# Patient Record
Sex: Female | Born: 1960 | Race: Black or African American | Hispanic: No | Marital: Single | Smoking: Never smoker
Health system: Southern US, Community
[De-identification: ages and names within clinical notes are randomized; demographics above are authoritative.]

## PROBLEM LIST (undated history)

## (undated) DIAGNOSIS — E041 Nontoxic single thyroid nodule: Secondary | ICD-10-CM

## (undated) HISTORY — PX: TUBAL LIGATION: SHX77

---

## 1997-07-03 ENCOUNTER — Other Ambulatory Visit: Admission: RE | Admit: 1997-07-03 | Discharge: 1997-07-03 | Payer: Self-pay | Admitting: Orthopedic Surgery

## 1997-07-22 ENCOUNTER — Other Ambulatory Visit: Admission: RE | Admit: 1997-07-22 | Discharge: 1997-07-22 | Payer: Self-pay | Admitting: Sports Medicine

## 1998-03-24 ENCOUNTER — Encounter: Payer: Self-pay | Admitting: Emergency Medicine

## 1998-03-24 ENCOUNTER — Emergency Department (HOSPITAL_COMMUNITY): Admission: EM | Admit: 1998-03-24 | Discharge: 1998-03-24 | Payer: Self-pay | Admitting: Internal Medicine

## 1998-04-04 ENCOUNTER — Ambulatory Visit (HOSPITAL_COMMUNITY): Admission: RE | Admit: 1998-04-04 | Discharge: 1998-04-04 | Payer: Self-pay | Admitting: Obstetrics

## 1998-04-28 ENCOUNTER — Inpatient Hospital Stay (HOSPITAL_COMMUNITY): Admission: AD | Admit: 1998-04-28 | Discharge: 1998-04-28 | Payer: Self-pay | Admitting: Obstetrics

## 1998-06-28 ENCOUNTER — Inpatient Hospital Stay (HOSPITAL_COMMUNITY): Admission: AD | Admit: 1998-06-28 | Discharge: 1998-06-28 | Payer: Self-pay | Admitting: Obstetrics

## 1998-09-30 ENCOUNTER — Inpatient Hospital Stay (HOSPITAL_COMMUNITY): Admission: AD | Admit: 1998-09-30 | Discharge: 1998-09-30 | Payer: Self-pay | Admitting: Obstetrics

## 1998-10-07 ENCOUNTER — Inpatient Hospital Stay (HOSPITAL_COMMUNITY): Admission: AD | Admit: 1998-10-07 | Discharge: 1998-10-07 | Payer: Self-pay | Admitting: Obstetrics

## 1998-11-08 ENCOUNTER — Encounter (INDEPENDENT_AMBULATORY_CARE_PROVIDER_SITE_OTHER): Payer: Self-pay

## 1998-11-08 ENCOUNTER — Inpatient Hospital Stay (HOSPITAL_COMMUNITY): Admission: AD | Admit: 1998-11-08 | Discharge: 1998-11-11 | Payer: Self-pay | Admitting: Obstetrics

## 1999-09-19 ENCOUNTER — Emergency Department (HOSPITAL_COMMUNITY): Admission: EM | Admit: 1999-09-19 | Discharge: 1999-09-19 | Payer: Self-pay | Admitting: Emergency Medicine

## 2000-08-31 ENCOUNTER — Inpatient Hospital Stay (HOSPITAL_COMMUNITY): Admission: AD | Admit: 2000-08-31 | Discharge: 2000-08-31 | Payer: Self-pay | Admitting: Obstetrics

## 2001-01-02 ENCOUNTER — Emergency Department (HOSPITAL_COMMUNITY): Admission: EM | Admit: 2001-01-02 | Discharge: 2001-01-02 | Payer: Self-pay | Admitting: Emergency Medicine

## 2003-11-26 ENCOUNTER — Emergency Department (HOSPITAL_COMMUNITY): Admission: EM | Admit: 2003-11-26 | Discharge: 2003-11-27 | Payer: Self-pay | Admitting: Emergency Medicine

## 2006-05-03 ENCOUNTER — Emergency Department (HOSPITAL_COMMUNITY): Admission: EM | Admit: 2006-05-03 | Discharge: 2006-05-03 | Payer: Self-pay | Admitting: Family Medicine

## 2006-05-07 ENCOUNTER — Emergency Department (HOSPITAL_COMMUNITY): Admission: EM | Admit: 2006-05-07 | Discharge: 2006-05-08 | Payer: Self-pay | Admitting: Emergency Medicine

## 2009-11-20 ENCOUNTER — Emergency Department (HOSPITAL_COMMUNITY): Admission: EM | Admit: 2009-11-20 | Discharge: 2009-11-20 | Payer: Self-pay | Admitting: Emergency Medicine

## 2010-06-05 ENCOUNTER — Other Ambulatory Visit (HOSPITAL_COMMUNITY)
Admission: RE | Admit: 2010-06-05 | Discharge: 2010-06-05 | Disposition: A | Discharge status: Home / Self Care | Payer: BC Managed Care – PPO | Source: Ambulatory Visit | Attending: Family Medicine | Admitting: Family Medicine

## 2010-06-05 ENCOUNTER — Other Ambulatory Visit: Payer: Self-pay | Admitting: Physician Assistant

## 2010-06-05 DIAGNOSIS — Z0000 Encounter for general adult medical examination without abnormal findings: Secondary | ICD-10-CM | POA: Insufficient documentation

## 2010-06-05 DIAGNOSIS — Z113 Encounter for screening for infections with a predominantly sexual mode of transmission: Secondary | ICD-10-CM | POA: Insufficient documentation

## 2010-06-11 ENCOUNTER — Other Ambulatory Visit: Payer: Self-pay | Admitting: Family Medicine

## 2010-06-11 DIAGNOSIS — E049 Nontoxic goiter, unspecified: Secondary | ICD-10-CM

## 2010-06-12 ENCOUNTER — Ambulatory Visit
Admission: RE | Admit: 2010-06-12 | Discharge: 2010-06-12 | Disposition: A | Discharge status: Home / Self Care | Payer: BC Managed Care – PPO | Source: Ambulatory Visit | Attending: Family Medicine | Admitting: Family Medicine

## 2010-06-12 DIAGNOSIS — E049 Nontoxic goiter, unspecified: Secondary | ICD-10-CM

## 2010-06-15 ENCOUNTER — Other Ambulatory Visit: Payer: Self-pay | Admitting: Family Medicine

## 2010-06-15 DIAGNOSIS — E042 Nontoxic multinodular goiter: Secondary | ICD-10-CM

## 2010-06-16 ENCOUNTER — Ambulatory Visit
Admission: RE | Admit: 2010-06-16 | Discharge: 2010-06-16 | Disposition: A | Discharge status: Home / Self Care | Payer: BC Managed Care – PPO | Source: Ambulatory Visit | Attending: Family Medicine | Admitting: Family Medicine

## 2010-06-16 ENCOUNTER — Other Ambulatory Visit: Payer: Self-pay | Admitting: Interventional Radiology

## 2010-06-16 ENCOUNTER — Other Ambulatory Visit (HOSPITAL_COMMUNITY)
Admission: RE | Admit: 2010-06-16 | Discharge: 2010-06-16 | Disposition: A | Discharge status: Home / Self Care | Payer: BC Managed Care – PPO | Source: Ambulatory Visit | Attending: Interventional Radiology | Admitting: Interventional Radiology

## 2010-06-16 DIAGNOSIS — E042 Nontoxic multinodular goiter: Secondary | ICD-10-CM

## 2010-06-16 DIAGNOSIS — E049 Nontoxic goiter, unspecified: Secondary | ICD-10-CM | POA: Insufficient documentation

## 2010-08-21 ENCOUNTER — Inpatient Hospital Stay (INDEPENDENT_AMBULATORY_CARE_PROVIDER_SITE_OTHER)
Admission: RE | Admit: 2010-08-21 | Discharge: 2010-08-21 | Disposition: A | Discharge status: Home / Self Care | Payer: BC Managed Care – PPO | Source: Ambulatory Visit | Attending: Family Medicine | Admitting: Family Medicine

## 2010-08-21 DIAGNOSIS — L0291 Cutaneous abscess, unspecified: Secondary | ICD-10-CM

## 2010-08-21 DIAGNOSIS — L0390 Cellulitis, unspecified: Secondary | ICD-10-CM

## 2010-11-23 ENCOUNTER — Inpatient Hospital Stay (INDEPENDENT_AMBULATORY_CARE_PROVIDER_SITE_OTHER)
Admission: RE | Admit: 2010-11-23 | Discharge: 2010-11-23 | Disposition: A | Discharge status: Home / Self Care | Payer: BC Managed Care – PPO | Source: Ambulatory Visit | Attending: Family Medicine | Admitting: Family Medicine

## 2010-11-23 DIAGNOSIS — B350 Tinea barbae and tinea capitis: Secondary | ICD-10-CM

## 2010-11-23 DIAGNOSIS — H6980 Other specified disorders of Eustachian tube, unspecified ear: Secondary | ICD-10-CM

## 2011-02-01 ENCOUNTER — Other Ambulatory Visit: Payer: Self-pay | Admitting: Internal Medicine

## 2011-02-01 DIAGNOSIS — E042 Nontoxic multinodular goiter: Secondary | ICD-10-CM

## 2011-02-23 ENCOUNTER — Other Ambulatory Visit: Payer: BC Managed Care – PPO

## 2011-03-10 ENCOUNTER — Other Ambulatory Visit: Payer: BC Managed Care – PPO

## 2011-03-16 ENCOUNTER — Ambulatory Visit
Admission: RE | Admit: 2011-03-16 | Discharge: 2011-03-16 | Disposition: A | Discharge status: Home / Self Care | Payer: BC Managed Care – PPO | Source: Ambulatory Visit | Attending: Internal Medicine | Admitting: Internal Medicine

## 2011-03-16 DIAGNOSIS — E042 Nontoxic multinodular goiter: Secondary | ICD-10-CM

## 2011-06-11 ENCOUNTER — Emergency Department (INDEPENDENT_AMBULATORY_CARE_PROVIDER_SITE_OTHER)
Admission: EM | Admit: 2011-06-11 | Discharge: 2011-06-11 | Disposition: A | Discharge status: Home / Self Care | Payer: BC Managed Care – PPO | Source: Home / Self Care | Attending: Emergency Medicine | Admitting: Emergency Medicine

## 2011-06-11 ENCOUNTER — Encounter (HOSPITAL_COMMUNITY): Payer: Self-pay

## 2011-06-11 ENCOUNTER — Emergency Department (INDEPENDENT_AMBULATORY_CARE_PROVIDER_SITE_OTHER): Payer: BC Managed Care – PPO

## 2011-06-11 DIAGNOSIS — S82409A Unspecified fracture of shaft of unspecified fibula, initial encounter for closed fracture: Secondary | ICD-10-CM

## 2011-06-11 MED ORDER — TRAMADOL HCL 50 MG PO TABS: 100.0000 mg | ORAL_TABLET | Freq: Three times a day (TID) | ORAL | Status: AC | PRN
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
## 2011-06-11 NOTE — Discharge Instructions (Signed)
Cast or Splint Care Casts and splints support injured limbs and keep bones from moving while they heal.  HOME CARE  Keep the cast or splint uncovered during the drying period.   A plaster cast can take 24 to 48 hours to dry.   A fiberglass cast will dry in less than 1 hour.   Do not rest the cast on anything harder than a pillow for 24 hours.   Do not put weight on your injured limb. Do not put pressure on the cast. Wait for your doctor's approval.   Keep the cast or splint dry.   Cover the cast or splint with a plastic bag during baths or wet weather.   If you have a cast over your chest and belly (trunk), take sponge baths until the cast is taken off.   Keep your cast or splint clean. Wash a dirty cast with a damp cloth.   Do not put any objects under your cast or splint. Do not scratch the skin under the cast with an object.   Do not take out the padding from inside your cast.   Exercise your joints near the cast as told by your doctor.   Raise (elevate) your injured limb on 1 or 2 pillows for the first 1 to 3 days.  GET HELP RIGHT AWAY IF:  Your cast or splint cracks.   Your cast or splint is too tight or too loose.   You itch badly under the cast.   Your cast gets wet or has a soft spot.   You have a bad smell coming from the cast.   You get an object stuck under the cast.   Your skin around the cast becomes red or raw.   You have new or more pain after the cast is put on.   You have fluid leaking through the cast.   You cannot move your fingers or toes.   Your fingers or toes turn colors or are cool, painful, or puffy (swollen).   You have tingling or lose feeling (numbness) around the injured area.   You have pain or pressure under the cast.   You have trouble breathing or have shortness of breath.   You have chest pain.  MAKE SURE YOU:  Understand these instructions.   Will watch your condition.   Will get help right away if you are not doing  well or get worse.  Document Released: 06/03/2010 Document Revised: 01/21/2011 Document Reviewed: 06/03/2010 [facility] Patient Information 2012 [place], [place].Fibular Fracture, Ankle, Adult, Undisplaced, Treated with Immobilization You have a break (fracture) of your fibula at the end of this bone which makes up part of your ankle. This is the bone in your lower leg located on the outside of the leg and it makes up the bump you feel on the outside of your ankle. These fractures are easily diagnosed with x-rays. TREATMENT  You have a simple fracture (this means it is in good position and the bones are not displaced) of the part of the fibula that is located at the ankle. This usually will heal without disability and can often be treated with only casting or splinting depending on the nature of the break.  HOME CARE INSTRUCTIONS   Apply ice to the injury for 15 to 20 minutes, 3 to 4 times per day while awake, for 2 days. Put the ice in a plastic bag and place a thin towel between the bag of ice and your leg. This  helps keep swelling down.   Use crutches as directed. Resume walking without crutches as directed by your caregiver or when comfortable doing so.   Only take over-the-counter or prescription medicines for pain, discomfort, or fever as directed by your caregiver.   Keep appointments for follow up X-rays if these are required.   If you have a removable splint or boot, do not remove the boot unless directed by your caregiver.   Warning: Do not drive a car or operate a motor vehicle until your caregiver specifically tells you it is safe to do so.  SEEK IMMEDIATE MEDICAL CARE IF:   Your cast gets damaged or breaks.   You have continued severe pain or more swelling than you did before the cast was put on, or the pain is not controlled with medications.   Your skin or nails below the injury turn blue or grey, or feel cold or numb.   There is a bad smell, or new stains and/or pus like  (purulent ) drainage coming from under the cast.   You develop severe pain in ankle or foot.  MAKE SURE YOU:   Understand these instructions.   Will watch your condition.   Will get help right away if you are not doing well or get worse.  Document Released: 10/24/2001 Document Revised: 01/21/2011 Document Reviewed: 09/08/2007 [facility] Patient Information 2012 [place], [place].

## 2011-06-11 NOTE — Progress Notes (Signed)
Orthopedic Tech Progress Note Patient Details:  Angelica Ponce [facility] 1960/12/01 [id number]  Type of Splint: Short Leg Splint Location: (L) LE Splint Interventions: Application    Jennye Moccasin 06/11/2011, 3:13 PM

## 2011-08-26 ENCOUNTER — Ambulatory Visit: Payer: BC Managed Care – PPO | Attending: Orthopedic Surgery

## 2011-08-26 ENCOUNTER — Ambulatory Visit: Payer: BC Managed Care – PPO | Admitting: Rehabilitation

## 2011-08-26 DIAGNOSIS — IMO0001 Reserved for inherently not codable concepts without codable children: Secondary | ICD-10-CM | POA: Insufficient documentation

## 2011-08-26 DIAGNOSIS — M25579 Pain in unspecified ankle and joints of unspecified foot: Secondary | ICD-10-CM | POA: Insufficient documentation

## 2011-09-21 ENCOUNTER — Ambulatory Visit: Payer: BC Managed Care – PPO | Attending: Orthopedic Surgery

## 2011-09-21 DIAGNOSIS — IMO0001 Reserved for inherently not codable concepts without codable children: Secondary | ICD-10-CM | POA: Insufficient documentation

## 2011-09-21 DIAGNOSIS — M25579 Pain in unspecified ankle and joints of unspecified foot: Secondary | ICD-10-CM | POA: Insufficient documentation

## 2012-05-12 ENCOUNTER — Other Ambulatory Visit: Payer: Self-pay | Admitting: Internal Medicine

## 2012-05-12 DIAGNOSIS — E049 Nontoxic goiter, unspecified: Secondary | ICD-10-CM

## 2012-05-23 ENCOUNTER — Ambulatory Visit
Admission: RE | Admit: 2012-05-23 | Discharge: 2012-05-23 | Disposition: A | Discharge status: Home / Self Care | Payer: BC Managed Care – PPO | Source: Ambulatory Visit | Attending: Internal Medicine | Admitting: Internal Medicine

## 2012-05-23 DIAGNOSIS — E049 Nontoxic goiter, unspecified: Secondary | ICD-10-CM

## 2012-08-07 ENCOUNTER — Other Ambulatory Visit (HOSPITAL_COMMUNITY)
Admission: RE | Admit: 2012-08-07 | Discharge: 2012-08-07 | Disposition: A | Discharge status: Home / Self Care | Payer: BC Managed Care – PPO | Source: Ambulatory Visit | Attending: Physician Assistant | Admitting: Physician Assistant

## 2012-08-07 ENCOUNTER — Other Ambulatory Visit: Payer: Self-pay | Admitting: Physician Assistant

## 2012-08-07 ENCOUNTER — Other Ambulatory Visit: Payer: Self-pay | Admitting: Family Medicine

## 2012-08-07 DIAGNOSIS — Z1151 Encounter for screening for human papillomavirus (HPV): Secondary | ICD-10-CM | POA: Insufficient documentation

## 2012-08-07 DIAGNOSIS — Z0000 Encounter for general adult medical examination without abnormal findings: Secondary | ICD-10-CM | POA: Insufficient documentation

## 2012-08-11 ENCOUNTER — Ambulatory Visit
Admission: RE | Admit: 2012-08-11 | Discharge: 2012-08-11 | Disposition: A | Discharge status: Home / Self Care | Payer: BC Managed Care – PPO | Source: Ambulatory Visit | Attending: Family Medicine | Admitting: Family Medicine

## 2012-11-17 ENCOUNTER — Other Ambulatory Visit: Payer: Self-pay | Admitting: Internal Medicine

## 2012-11-17 DIAGNOSIS — E042 Nontoxic multinodular goiter: Secondary | ICD-10-CM

## 2012-11-22 ENCOUNTER — Ambulatory Visit
Admission: RE | Admit: 2012-11-22 | Discharge: 2012-11-22 | Disposition: A | Discharge status: Home / Self Care | Payer: BC Managed Care – PPO | Source: Ambulatory Visit | Attending: Internal Medicine | Admitting: Internal Medicine

## 2012-11-22 DIAGNOSIS — E042 Nontoxic multinodular goiter: Secondary | ICD-10-CM

## 2013-06-28 ENCOUNTER — Other Ambulatory Visit: Payer: Self-pay | Admitting: Obstetrics & Gynecology

## 2013-06-28 ENCOUNTER — Other Ambulatory Visit (HOSPITAL_COMMUNITY)
Admission: RE | Admit: 2013-06-28 | Discharge: 2013-06-28 | Disposition: A | Discharge status: Home / Self Care | Payer: BC Managed Care – PPO | Source: Ambulatory Visit | Attending: Obstetrics & Gynecology | Admitting: Obstetrics & Gynecology

## 2013-06-28 DIAGNOSIS — Z113 Encounter for screening for infections with a predominantly sexual mode of transmission: Secondary | ICD-10-CM | POA: Insufficient documentation

## 2013-06-28 DIAGNOSIS — Z124 Encounter for screening for malignant neoplasm of cervix: Secondary | ICD-10-CM | POA: Insufficient documentation

## 2013-06-28 DIAGNOSIS — Z1151 Encounter for screening for human papillomavirus (HPV): Secondary | ICD-10-CM | POA: Insufficient documentation

## 2013-11-01 ENCOUNTER — Other Ambulatory Visit: Payer: Self-pay | Admitting: Obstetrics & Gynecology

## 2014-04-17 ENCOUNTER — Emergency Department (HOSPITAL_COMMUNITY)
Admission: EM | Admit: 2014-04-17 | Discharge: 2014-04-17 | Disposition: A | Discharge status: Home / Self Care | Payer: BLUE CROSS/BLUE SHIELD | Source: Home / Self Care | Attending: Family Medicine | Admitting: Family Medicine

## 2014-04-17 ENCOUNTER — Encounter (HOSPITAL_COMMUNITY): Payer: Self-pay | Admitting: Family Medicine

## 2014-04-17 DIAGNOSIS — R6889 Other general symptoms and signs: Secondary | ICD-10-CM

## 2014-04-17 HISTORY — DX: Nontoxic single thyroid nodule: E04.1

## 2014-04-17 LAB — POCT RAPID STREP A: STREPTOCOCCUS, GROUP A SCREEN (DIRECT): NEGATIVE

## 2014-04-17 NOTE — ED Provider Notes (Signed)
CSN: [id number]     Arrival date & time 04/17/14  1213 History   First MD Initiated Contact with Patient 04/17/14 1323     Chief Complaint  Patient presents with  . URI   (Consider location/radiation/quality/duration/timing/severity/associated sxs/prior Treatment) HPI  Cold symptoms started 4 days ago. Ache joints, subjective fevers, chills, runny nose and cough. Denies sore throat. Taking ibupfrofen and BC and robitussin w/ some improvement. Constant. No chagne in overall condition. Denies nausea, vomiting, dysuria, frequency, ABD pain, CP, SOB, palpitations.    Past Medical History  Diagnosis Date  . Thyroid nodule    Past Surgical History  Procedure Laterality Date  . Tubal ligation     Family History  Problem Relation Age of Onset  . Cancer Mother     breast cancer  . Migraines Mother   . Heart attack Maternal Grandmother   . Migraines Maternal Grandmother   . Heart attack Paternal Grandmother   . Migraines Paternal Grandmother    History  Substance Use Topics  . Smoking status: Never Smoker   . Smokeless tobacco: Not on file  . Alcohol Use: Yes   OB History    No data available     Review of Systems Per HPI with all other pertinent systems negative.   Allergies  Review of patient's allergies indicates no known allergies.  Home Medications   Prior to Admission medications   Not on File   BP 113/75 mmHg  Pulse 88  Temp(Src) 99.2 F (37.3 C) (Oral)  Resp 16  SpO2 100%  LMP 03/24/2014 Physical Exam  Constitutional: She is oriented to person, place, and time. She appears well-developed and well-nourished.  HENT:  Head: Normocephalic and atraumatic.  Eyes: EOM are normal. Pupils are equal, round, and reactive to light.  Neck: Normal range of motion. Thyromegaly present.  Cardiovascular: Normal rate.   No murmur heard. Pulmonary/Chest: Effort normal. No respiratory distress. She has no wheezes. She has no rales. She exhibits no tenderness.  Abdominal:  Soft. Bowel sounds are normal.  Musculoskeletal: Normal range of motion.  Neurological: She is alert and oriented to person, place, and time.  Skin: Skin is warm.  Psychiatric: She has a normal mood and affect. Her behavior is normal. Judgment and thought content normal.    ED Course  Procedures (including critical care time) Labs Review Labs Reviewed  POCT RAPID STREP A ([facility] ONLY)    Imaging Review No results found.   MDM   1. Flu-like symptoms    Patient beyond window for treatment with Tamiflu. Conservative management including fluids, NSAIDs, rest.  No sign of pneumonia, or bacterial sinusitis. Work note provided . Precautions given and all questions answered   Shelly Flattenavid Merrell, MD Family Medicine 04/17/2014, 1:40 PM        Ozella Rocksavid J Merrell, MD 04/17/14 1340

## 2014-04-17 NOTE — ED Notes (Signed)
C/o cold sx onset Sunday Sx include ST, chills, HA, cough, chest d/c when coughing, bilateral leg pain Taking OTC meds w/no relief Alert, no signs of acute distress.

## 2014-04-17 NOTE — Discharge Instructions (Signed)
You likely have the flu. The flu illness typically lasts 3-7 days. You should start getting better soon. These stay well hydrated, get plenty of rest, use ibuprofen 600 mg every 6 hours for pain relief, please also try to stay active and remember to take lots of deep breaths to help prevent pneumonia. Please come back or go to the emergency room if he gets significantly worse.

## 2014-04-19 LAB — CULTURE, GROUP A STREP: Strep A Culture: NEGATIVE

## 2014-11-18 ENCOUNTER — Other Ambulatory Visit: Payer: Self-pay | Admitting: Internal Medicine

## 2014-11-18 DIAGNOSIS — R9389 Abnormal findings on diagnostic imaging of other specified body structures: Secondary | ICD-10-CM

## 2014-11-18 DIAGNOSIS — E049 Nontoxic goiter, unspecified: Secondary | ICD-10-CM

## 2014-11-21 ENCOUNTER — Ambulatory Visit
Admission: RE | Admit: 2014-11-21 | Discharge: 2014-11-21 | Disposition: A | Discharge status: Home / Self Care | Payer: Managed Care, Other (non HMO) | Source: Ambulatory Visit | Attending: Internal Medicine | Admitting: Internal Medicine

## 2014-11-21 DIAGNOSIS — R9389 Abnormal findings on diagnostic imaging of other specified body structures: Secondary | ICD-10-CM

## 2014-11-21 DIAGNOSIS — E049 Nontoxic goiter, unspecified: Secondary | ICD-10-CM

## 2014-12-10 ENCOUNTER — Other Ambulatory Visit (HOSPITAL_COMMUNITY)
Admission: RE | Admit: 2014-12-10 | Discharge: 2014-12-10 | Disposition: A | Discharge status: Home / Self Care | Payer: Managed Care, Other (non HMO) | Source: Ambulatory Visit | Attending: Obstetrics & Gynecology | Admitting: Obstetrics & Gynecology

## 2014-12-10 ENCOUNTER — Other Ambulatory Visit: Payer: Self-pay | Admitting: Obstetrics & Gynecology

## 2014-12-10 DIAGNOSIS — Z113 Encounter for screening for infections with a predominantly sexual mode of transmission: Secondary | ICD-10-CM | POA: Insufficient documentation

## 2014-12-10 DIAGNOSIS — Z01411 Encounter for gynecological examination (general) (routine) with abnormal findings: Secondary | ICD-10-CM | POA: Diagnosis not present

## 2014-12-11 LAB — CYTOLOGY - PAP

## 2017-12-14 ENCOUNTER — Other Ambulatory Visit: Payer: Self-pay | Admitting: Obstetrics & Gynecology

## 2017-12-14 DIAGNOSIS — N6320 Unspecified lump in the left breast, unspecified quadrant: Secondary | ICD-10-CM

## 2017-12-20 ENCOUNTER — Ambulatory Visit
Admission: RE | Admit: 2017-12-20 | Discharge: 2017-12-20 | Disposition: A | Discharge status: Home / Self Care | Payer: Managed Care, Other (non HMO) | Source: Ambulatory Visit | Attending: Obstetrics & Gynecology | Admitting: Obstetrics & Gynecology

## 2017-12-20 DIAGNOSIS — N6320 Unspecified lump in the left breast, unspecified quadrant: Secondary | ICD-10-CM

## 2019-08-13 ENCOUNTER — Other Ambulatory Visit: Payer: Self-pay | Admitting: Obstetrics & Gynecology

## 2019-08-13 DIAGNOSIS — Z1231 Encounter for screening mammogram for malignant neoplasm of breast: Secondary | ICD-10-CM

## 2019-09-14 ENCOUNTER — Ambulatory Visit: Payer: Managed Care, Other (non HMO)

## 2019-09-28 ENCOUNTER — Ambulatory Visit: Payer: Managed Care, Other (non HMO)

## 2020-07-24 ENCOUNTER — Other Ambulatory Visit: Payer: Self-pay

## 2020-07-24 ENCOUNTER — Ambulatory Visit (INDEPENDENT_AMBULATORY_CARE_PROVIDER_SITE_OTHER): Payer: Managed Care, Other (non HMO)

## 2020-07-24 ENCOUNTER — Ambulatory Visit (HOSPITAL_COMMUNITY)
Admission: EM | Admit: 2020-07-24 | Discharge: 2020-07-24 | Disposition: A | Discharge status: Home / Self Care | Payer: Managed Care, Other (non HMO) | Attending: Physician Assistant | Admitting: Physician Assistant

## 2020-07-24 ENCOUNTER — Encounter (HOSPITAL_COMMUNITY): Payer: Self-pay

## 2020-07-24 DIAGNOSIS — M545 Low back pain, unspecified: Secondary | ICD-10-CM

## 2020-07-24 DIAGNOSIS — M25551 Pain in right hip: Secondary | ICD-10-CM | POA: Diagnosis not present

## 2020-07-24 DIAGNOSIS — R52 Pain, unspecified: Secondary | ICD-10-CM

## 2020-07-24 MED ORDER — TIZANIDINE HCL 4 MG PO CAPS: 4.0000 mg | ORAL_CAPSULE | Freq: Three times a day (TID) | ORAL | 0 refills | Status: DC

## 2020-07-24 MED ORDER — NAPROXEN 375 MG PO TABS: 375.0000 mg | ORAL_TABLET | Freq: Two times a day (BID) | ORAL | 0 refills | Status: DC
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
## 2020-07-24 NOTE — Discharge Instructions (Signed)
Your x-ray showed no fractures or dislocations which is great news.  There was an incidental finding of a calcified area around your bladder/uterus.  If you develop any pain or blood in your urine please be reevaluated.  Follow-up with your primary care provider regarding this.  I have called in Naprosyn and you can take this twice a day as needed.  You should not take additional NSAIDs including aspirin, ibuprofen/Advil, naproxen/Aleve with this medication due to risk of GI bleeding.  I have also called in tizanidine which is a muscle relaxer.  You should not drive or drink alcohol with this medication as drowsiness is a common side effect.  Use heat and stretch for symptom relief.  Follow-up with your primary care provider and consider a referral to physical therapy.
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
## 2020-11-17 ENCOUNTER — Other Ambulatory Visit: Payer: Self-pay | Admitting: Obstetrics & Gynecology

## 2020-11-17 DIAGNOSIS — Z1231 Encounter for screening mammogram for malignant neoplasm of breast: Secondary | ICD-10-CM

## 2020-12-19 ENCOUNTER — Ambulatory Visit
Admission: RE | Admit: 2020-12-19 | Discharge: 2020-12-19 | Disposition: A | Discharge status: Home / Self Care | Payer: Managed Care, Other (non HMO) | Source: Ambulatory Visit | Attending: Obstetrics & Gynecology | Admitting: Obstetrics & Gynecology

## 2020-12-19 ENCOUNTER — Other Ambulatory Visit: Payer: Self-pay

## 2020-12-19 DIAGNOSIS — Z1231 Encounter for screening mammogram for malignant neoplasm of breast: Secondary | ICD-10-CM

## 2021-01-22 ENCOUNTER — Other Ambulatory Visit: Payer: Self-pay

## 2021-01-22 ENCOUNTER — Ambulatory Visit: Payer: Managed Care, Other (non HMO) | Admitting: Orthopaedic Surgery

## 2021-01-22 DIAGNOSIS — G8929 Other chronic pain: Secondary | ICD-10-CM | POA: Diagnosis not present

## 2021-01-22 DIAGNOSIS — M5441 Lumbago with sciatica, right side: Secondary | ICD-10-CM
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
## 2021-01-23 ENCOUNTER — Ambulatory Visit: Payer: Managed Care, Other (non HMO) | Admitting: Orthopaedic Surgery

## 2021-02-10 ENCOUNTER — Ambulatory Visit: Payer: Managed Care, Other (non HMO) | Admitting: Orthopaedic Surgery

## 2021-02-10 ENCOUNTER — Other Ambulatory Visit: Payer: Self-pay

## 2021-02-10 DIAGNOSIS — M79604 Pain in right leg: Secondary | ICD-10-CM

## 2021-02-10 MED ORDER — PREGABALIN 75 MG PO CAPS: 75.0000 mg | ORAL_CAPSULE | Freq: Two times a day (BID) | ORAL | 2 refills | Status: DC

## 2021-02-10 MED ORDER — DICLOFENAC SODIUM 75 MG PO TBEC
75.0000 mg | DELAYED_RELEASE_TABLET | Freq: Two times a day (BID) | ORAL | 2 refills | Status: DC
Start: 2021-02-10 — End: 2022-12-30
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
## 2021-07-27 ENCOUNTER — Other Ambulatory Visit: Payer: Self-pay | Admitting: Obstetrics & Gynecology

## 2021-07-27 ENCOUNTER — Other Ambulatory Visit: Payer: Self-pay | Admitting: Nurse Practitioner

## 2021-07-27 DIAGNOSIS — Z1231 Encounter for screening mammogram for malignant neoplasm of breast: Secondary | ICD-10-CM
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
## 2021-12-25 ENCOUNTER — Ambulatory Visit
Admission: RE | Admit: 2021-12-25 | Discharge: 2021-12-25 | Disposition: A | Discharge status: Home / Self Care | Payer: Managed Care, Other (non HMO) | Source: Ambulatory Visit | Attending: Nurse Practitioner | Admitting: Nurse Practitioner

## 2021-12-25 DIAGNOSIS — Z1231 Encounter for screening mammogram for malignant neoplasm of breast: Secondary | ICD-10-CM

## 2022-04-16 ENCOUNTER — Other Ambulatory Visit: Payer: Self-pay | Admitting: Physician Assistant

## 2022-04-16 ENCOUNTER — Ambulatory Visit
Admission: RE | Admit: 2022-04-16 | Discharge: 2022-04-16 | Disposition: A | Discharge status: Home / Self Care | Payer: Managed Care, Other (non HMO) | Source: Ambulatory Visit | Attending: Physician Assistant | Admitting: Physician Assistant

## 2022-04-16 DIAGNOSIS — R0602 Shortness of breath: Secondary | ICD-10-CM

## 2022-05-14 DIAGNOSIS — M545 Low back pain, unspecified: Secondary | ICD-10-CM | POA: Insufficient documentation

## 2022-07-23 ENCOUNTER — Other Ambulatory Visit (HOSPITAL_COMMUNITY)
Admission: RE | Admit: 2022-07-23 | Discharge: 2022-07-23 | Disposition: A | Discharge status: Home / Self Care | Payer: Medicaid Other | Source: Ambulatory Visit | Attending: Nurse Practitioner | Admitting: Nurse Practitioner

## 2022-07-23 ENCOUNTER — Other Ambulatory Visit: Payer: Self-pay | Admitting: Nurse Practitioner

## 2022-07-23 DIAGNOSIS — Z124 Encounter for screening for malignant neoplasm of cervix: Secondary | ICD-10-CM | POA: Insufficient documentation

## 2022-07-30 LAB — CYTOLOGY - PAP
Comment: NEGATIVE
Diagnosis: NEGATIVE
Diagnosis: REACTIVE
High risk HPV: NEGATIVE

## 2022-12-30 ENCOUNTER — Ambulatory Visit (INDEPENDENT_AMBULATORY_CARE_PROVIDER_SITE_OTHER): Payer: Medicaid Other

## 2022-12-30 ENCOUNTER — Encounter: Payer: Self-pay | Admitting: Podiatry

## 2022-12-30 ENCOUNTER — Ambulatory Visit (INDEPENDENT_AMBULATORY_CARE_PROVIDER_SITE_OTHER): Payer: Medicaid Other | Admitting: Podiatry

## 2022-12-30 DIAGNOSIS — R5383 Other fatigue: Secondary | ICD-10-CM | POA: Insufficient documentation

## 2022-12-30 DIAGNOSIS — M5412 Radiculopathy, cervical region: Secondary | ICD-10-CM | POA: Insufficient documentation

## 2022-12-30 DIAGNOSIS — G8929 Other chronic pain: Secondary | ICD-10-CM | POA: Insufficient documentation

## 2022-12-30 DIAGNOSIS — M0600 Rheumatoid arthritis without rheumatoid factor, unspecified site: Secondary | ICD-10-CM | POA: Insufficient documentation

## 2022-12-30 DIAGNOSIS — M722 Plantar fascial fibromatosis: Secondary | ICD-10-CM | POA: Diagnosis not present

## 2022-12-30 DIAGNOSIS — M1990 Unspecified osteoarthritis, unspecified site: Secondary | ICD-10-CM | POA: Insufficient documentation

## 2022-12-30 DIAGNOSIS — R768 Other specified abnormal immunological findings in serum: Secondary | ICD-10-CM | POA: Insufficient documentation

## 2022-12-30 MED ORDER — TRIAMCINOLONE ACETONIDE 40 MG/ML IJ SUSP
20.0000 mg | Freq: Once | INTRAMUSCULAR | Status: AC
  Administered 2022-12-30: 20 mg

## 2022-12-30 MED ORDER — METHYLPREDNISOLONE 4 MG PO TBPK: ORAL_TABLET | ORAL | 0 refills | Status: DC

## 2022-12-30 MED ORDER — MELOXICAM 15 MG PO TABS: 15.0000 mg | ORAL_TABLET | Freq: Every day | ORAL | 3 refills | Status: DC

## 2022-12-30 NOTE — Patient Instructions (Signed)

## 2023-01-02 NOTE — Progress Notes (Signed)
  Subjective:  Patient ID: Angelica Ponce, female    DOB: 12-02-1960,  MRN: [id number] HPI Chief Complaint  Patient presents with   Foot Pain    Plantar heel left - aching x couple weeks, AM pain, tried icy hot and Ibuprofen -temp relief, does take gabapentin for other issues, so that helps sometimes, gets burning in the heel at night   New Patient (Initial Visit)    62 y.o. female presents with the above complaint.   ROS: Denies fever chills nausea vomit muscle aches pains calf pain back pain chest pain shortness of breath.  Past Medical History:  Diagnosis Date   Thyroid nodule    Past Surgical History:  Procedure Laterality Date   TUBAL LIGATION      Current Outpatient Medications:    gabapentin (NEURONTIN) 600 MG tablet, Take 600 mg by mouth 2 (two) times daily., Disp: , Rfl:    meloxicam (MOBIC) 15 MG tablet, Take 1 tablet (15 mg total) by mouth daily., Disp: 30 tablet, Rfl: 3   methylPREDNISolone (MEDROL DOSEPAK) 4 MG TBPK tablet, 6 day dose pack - take as directed, Disp: 21 tablet, Rfl: 0  Allergies  Allergen Reactions   Latex     Other reaction(s): rash   Procaine     Other reaction(s): rash   Review of Systems Objective:  There were no vitals filed for this visit.  General: Well developed, nourished, in no acute distress, alert and oriented x3   Dermatological: Skin is warm, dry and supple bilateral. Nails x 10 are well maintained; remaining integument appears unremarkable at this time. There are no open sores, no preulcerative lesions, no rash or signs of infection present.  Vascular: Dorsalis Pedis artery and Posterior Tibial artery pedal pulses are 2/4 bilateral with immedate capillary fill time. Pedal hair growth present. No varicosities and no lower extremity edema present bilateral.   Neruologic: Grossly intact via light touch bilateral. Vibratory intact via tuning fork bilateral. Protective threshold with Semmes Wienstein monofilament intact to all  pedal sites bilateral. Patellar and Achilles deep tendon reflexes 2+ bilateral. No Babinski or clonus noted bilateral.   Musculoskeletal: No gross boney pedal deformities bilateral. No pain, crepitus, or limitation noted with foot and ankle range of motion bilateral. Muscular strength 5/5 in all groups tested bilateral.  Pain to palpation medial calcaneal tubercle left foot  Gait: Unassisted, Nonantalgic.    Radiographs:  Radiographs consistent osseously mature individual no significant acute abnormality.  Assessment & Plan:   Assessment: Plan fasciitis.  Plan: Injected the area 20 mg Kenalog 5 mg Marcaine point of maximal tenderness discussed appropriate shoe gear.  Started her on gabapentin 600 mg twice daily started on meloxicam and methylprednisolone.     Ixchel Duck T. [place], [place]

## 2023-01-23 ENCOUNTER — Emergency Department (HOSPITAL_COMMUNITY)
Admission: EM | Admit: 2023-01-23 | Discharge: 2023-01-23 | Disposition: A | Discharge status: Home / Self Care | Payer: Medicaid Other | Attending: Emergency Medicine | Admitting: Emergency Medicine

## 2023-01-23 ENCOUNTER — Emergency Department (HOSPITAL_BASED_OUTPATIENT_CLINIC_OR_DEPARTMENT_OTHER): Payer: Medicaid Other

## 2023-01-23 ENCOUNTER — Other Ambulatory Visit: Payer: Self-pay

## 2023-01-23 DIAGNOSIS — Z91040 Latex allergy status: Secondary | ICD-10-CM | POA: Diagnosis not present

## 2023-01-23 DIAGNOSIS — M542 Cervicalgia: Secondary | ICD-10-CM | POA: Insufficient documentation

## 2023-01-23 DIAGNOSIS — M545 Low back pain, unspecified: Secondary | ICD-10-CM | POA: Diagnosis present

## 2023-01-23 DIAGNOSIS — M79661 Pain in right lower leg: Secondary | ICD-10-CM

## 2023-01-23 DIAGNOSIS — G8929 Other chronic pain: Secondary | ICD-10-CM | POA: Diagnosis not present

## 2023-01-23 DIAGNOSIS — M5441 Lumbago with sciatica, right side: Secondary | ICD-10-CM | POA: Insufficient documentation

## 2023-01-23 MED ORDER — HYDROCODONE-ACETAMINOPHEN 5-325 MG PO TABS: 1.0000 | ORAL_TABLET | Freq: Four times a day (QID) | ORAL | 0 refills | Status: AC | PRN

## 2023-01-23 MED ORDER — PREDNISONE 20 MG PO TABS: 40.0000 mg | ORAL_TABLET | Freq: Every day | ORAL | 0 refills | Status: AC

## 2023-01-23 MED ORDER — KETOROLAC TROMETHAMINE 15 MG/ML IJ SOLN
15.0000 mg | Freq: Once | INTRAMUSCULAR | Status: AC
  Administered 2023-01-23: 15 mg via INTRAVENOUS
  Filled 2023-01-23: qty 1

## 2023-01-23 NOTE — ED Provider Notes (Signed)
[place] EMERGENCY DEPARTMENT AT [facility] Provider Note   CSN: [id number] Arrival date & time: 01/23/23  1706     History  Chief Complaint  Patient presents with   Neck Pain   Back Pain    Angelica Ponce is a 62 y.o. female patient with past medical history of low back pain, pain in right foot, chronic pain of both shoulders and cervical radiculopathy presenting to emergency room with right sided low back pain that has been ongoing for several months.  Patient reports within the last 2 days she noted that her right lower leg is swelling and increasingly painful.  She is also reporting that she has right shoulder pain which is chronic she has been taking gabapentin muscle relaxer and tramadol and continues to have breakthrough pain.  Reports she follows with orthopedics for shoulders as well as back pain.  Able to move extremities without difficulty. No history of DVT or PE.  Patient is on blood thinner.   Neck Pain Back Pain      Home Medications Prior to Admission medications   Medication Sig Start Date End Date Taking? Authorizing Provider  gabapentin (NEURONTIN) 600 MG tablet Take 600 mg by mouth 2 (two) times daily.    [provider]  meloxicam (MOBIC) 15 MG tablet Take 1 tablet (15 mg total) by mouth daily. 12/30/22   Hyatt, Max T, DPM  methylPREDNISolone (MEDROL DOSEPAK) 4 MG TBPK tablet 6 day dose pack - take as directed 12/30/22   Hyatt, Max T, DPM      Allergies    Latex and Procaine    Review of Systems   Review of Systems  Musculoskeletal:  Positive for back pain and neck pain.    Physical Exam Updated Vital Signs Ht 5\' 2"  (1.575 m)   Wt 61.7 kg   LMP 03/24/2014   BMI 24.87 kg/m  Physical Exam Vitals and nursing note reviewed.  Constitutional:      General: She is not in acute distress.    Appearance: She is not toxic-appearing.  HENT:     Head: Normocephalic and atraumatic.  Eyes:     General: No scleral icterus.     Conjunctiva/sclera: Conjunctivae normal.  Cardiovascular:     Rate and Rhythm: Normal rate and regular rhythm.     Pulses: Normal pulses.     Heart sounds: Normal heart sounds.  Pulmonary:     Effort: Pulmonary effort is normal. No respiratory distress.     Breath sounds: Normal breath sounds. No wheezing or rales.  Chest:     Chest wall: No tenderness.  Abdominal:     General: Abdomen is flat. Bowel sounds are normal.     Palpations: Abdomen is soft.     Tenderness: There is no abdominal tenderness.  Musculoskeletal:     Right lower leg: Edema present.     Left lower leg: No edema.     Comments: Moving upper and lower extremities without difficulty no weakness noted on exam strong dorsal pedal pulse equal bilaterally.  Strong radial pulse equal bilaterally  Patient has bilateral low back pain over musculature.  No midline tenderness or step-off nor deformity..  Patient denies saddle anesthesia loss of bowel or bladder.  Patient has right sided neck pain.  No cervical midline tenderness or step-off, deformity.  Patient denying radicular symptoms.  Skin:    General: Skin is warm and dry.     Findings: No lesion.  Neurological:  General: No focal deficit present.     Mental Status: She is alert and oriented to person, place, and time. Mental status is at baseline.     ED Results / Procedures / Treatments   Labs (all labs ordered are listed, but only abnormal results are displayed) Labs Reviewed - No data to display  EKG None  Radiology VAS [place] LOWER EXTREMITY VENOUS (DVT) (7a-7p)  Result Date: 01/24/2023  Lower Venous DVT Study Patient Name:  GENEVIA VOLPE  Date of Exam:   01/23/2023 Medical Rec #: [id number]               Accession #:    [id number] Date of Birth: 11/05/1960              Patient Gender: F Patient Age:   53 years Exam Location:  [facility] Procedure:      VAS [place] LOWER EXTREMITY VENOUS (DVT) Referring Phys: Asher Muir Travaughn Vue  --------------------------------------------------------------------------------  Indications: Right hip pain radiating down the lateral thigh.  Comparison Study: No prior study on file Performing Technologist: Sherren Kerns RVS  Examination Guidelines: A complete evaluation includes B-mode imaging, spectral Doppler, color Doppler, and power Doppler as needed of all accessible portions of each vessel. Bilateral testing is considered an integral part of a complete examination. Limited examinations for reoccurring indications may be performed as noted. The reflux portion of the exam is performed with the patient in reverse Trendelenburg.  +---------+---------------+---------+-----------+----------+--------------+ RIGHT    CompressibilityPhasicitySpontaneityPropertiesThrombus Aging +---------+---------------+---------+-----------+----------+--------------+ CFV      Full           Yes      Yes                                 +---------+---------------+---------+-----------+----------+--------------+ SFJ      Full                                                        +---------+---------------+---------+-----------+----------+--------------+ FV Prox  Full                                                        +---------+---------------+---------+-----------+----------+--------------+ FV Mid   Full                                                        +---------+---------------+---------+-----------+----------+--------------+ FV DistalFull                                                        +---------+---------------+---------+-----------+----------+--------------+ PFV      Full                                                        +---------+---------------+---------+-----------+----------+--------------+  POP      Full                                                        +---------+---------------+---------+-----------+----------+--------------+ PTV       Full                                                        +---------+---------------+---------+-----------+----------+--------------+ PERO     Full                                                        +---------+---------------+---------+-----------+----------+--------------+   +----+---------------+---------+-----------+----------+--------------+ LEFTCompressibilityPhasicitySpontaneityPropertiesThrombus Aging +----+---------------+---------+-----------+----------+--------------+ CFV Full           Yes      Yes                                 +----+---------------+---------+-----------+----------+--------------+     Summary: RIGHT: - There is no evidence of deep vein thrombosis in the lower extremity.  - No cystic structure found in the popliteal fossa.  LEFT: - No evidence of common femoral vein obstruction.   *See table(s) above for measurements and observations. Electronically signed by Coral Else MD on 01/24/2023 at 12:15:46 PM.    Final     Procedures Procedures    Medications Ordered in ED Medications - No data to display  ED Course/ Medical Decision Making/ A&P Clinical Course as of 01/23/23 2012  Sun Jan 23, 2023  1938 Doppler study negative for DVT [JK]    Clinical Course User Index [JK] Linwood Dibbles, MD                                 Medical Decision Making Risk Prescription drug management.   Cyrilla Gustie Pavon 62 y.o. presented today for back pain and right leg swelling. Working Ddx: MSK in nature, fracture, epidural hematoma/abscess, cauda equina syndrome, spinal stenosis, spinal malignancy, discitis, spinal infection, spondylitises/ spondylosis, conus medullaris, DDD of the back.  R/o DDx: Cauda equina syndrome and additional dx are less likely than current impression due to history of present illness, physical exam, findings. No focal neurological deficits, no loss of bowel or bladder control.  Denies fever, night sweats, weight loss, h/o  cancer, IVDU.  PMHX: chronic pain    Review of prior external notes: 12/15/2022 when she was seen at [facility] for lumbar radiculopathy.  09/30/2022 when she was seen with [facility] for cervical radiculopathy.  Labs: None.  Imaging: Right DVT study which is negative.   Problem List / ED Course / Critical interventions / Medication management  Patient reporting to emergency room with chronic pain.  On physical exam she is able to move extremities without difficulty.  Patient has appropriate follow-up for her neck and back pain.  She is established with providers and has ongoing care.  Given that patient  feels right leg has started to swell with increased generalized tenderness I would like to rule out DVT.  Ultrasound ordered.  Physical exam is reassuring.  Patient hemodynamically stable and well-appearing No red flag symptoms of back pain.  Patient's symptoms improved after Toradol. I ordered medication including Toradol  Reevaluation of the patient after these medicines showed that the patient improved Patients vitals assessed. Upon arrival patient is hemodynamically stable.  I have reviewed the patients home medicines and have made adjustments as needed  Consult: none      Plan:  F/u w/ PCP in 2-3d to ensure resolution of sx.  RICE protocol and pain medicine discussed with patient.  Patient was given return precautions. Patient stable for discharge at this time.  Patient educated on sx/dx and verbalized understanding of plan. Return to ER with new or worsening sx.           Final Clinical Impression(s) / ED Diagnoses Final diagnoses:  Chronic right-sided low back pain with right-sided sciatica    Rx / DC Orders ED Discharge Orders     None         Smitty Knudsen, PA-C 01/25/23 1652    Linwood Dibbles, MD 01/30/23 1551

## 2023-01-23 NOTE — Discharge Instructions (Addendum)
You were seen in the emergency room today for low back pain and neck pain.  Your ultrasound to rule out blood clot of your right leg was negative so do not have a blood clot.  For the swelling I would recommend elevation and compression stockings and trying to monitor sodium intake.  Please continue taking anti-inflammatory, Tylenol, muscle relaxer for pain control. I have sent steroids take as prescribed and Norco to your pharmacy, only use for breakthrough pain. I would also recommend ice and heat and following up with your established spine doctor and primary care doctor.  Please return to emergency room with any new or worsening symptoms.

## 2023-01-23 NOTE — ED Triage Notes (Signed)
Pt arrived via POV. C/o neck, back and R leg pain that began several months ago. States right leg has been swelling, no swelling noted

## 2023-01-23 NOTE — Progress Notes (Signed)
VASCULAR LAB    Right lower extremity venous duplex has been performed.  See CV proc for preliminary results.  Gave verbal report to Dr. Berniece Pap, [facility], RVT 01/23/2023, 7:43 PM

## 2023-01-31 ENCOUNTER — Telehealth: Payer: Self-pay

## 2023-01-31 NOTE — Telephone Encounter (Signed)
Documents received from [facility] regarding LTD claim. [facility] Called patient to inform them documents sent to wrong facility and asked for information on who documents should be sent to. Faxed documents back to [facility] informing them documents sent to wrong facility and with Correct provider's information per patient's request. No further concerns at this time.

## 2023-02-01 ENCOUNTER — Ambulatory Visit (INDEPENDENT_AMBULATORY_CARE_PROVIDER_SITE_OTHER): Payer: Medicaid Other | Admitting: Podiatry

## 2023-02-01 ENCOUNTER — Encounter: Payer: Self-pay | Admitting: Podiatry

## 2023-02-01 DIAGNOSIS — M722 Plantar fascial fibromatosis: Secondary | ICD-10-CM | POA: Diagnosis not present

## 2023-02-01 DIAGNOSIS — M79643 Pain in unspecified hand: Secondary | ICD-10-CM | POA: Insufficient documentation

## 2023-02-01 MED ORDER — TRIAMCINOLONE ACETONIDE 40 MG/ML IJ SUSP
20.0000 mg | Freq: Once | INTRAMUSCULAR | Status: AC
  Administered 2023-02-01: 20 mg

## 2023-02-01 NOTE — Progress Notes (Signed)
Presents today for follow-up of plantar fasciitis states that is about 70% improved.  Objective: Vital signs are stable alert oriented x 3.  Has pain on palpation medial calcaneal tubercle of her left heel.  Assessment: Resolution of plantar fasciitis by about 70% left.  Plan: Injected the left heel today 20 mg Kenalog 5 mg Marcaine continue conservative therapies follow-up as needed

## 2023-02-22 ENCOUNTER — Telehealth: Payer: Self-pay | Admitting: Podiatry

## 2023-02-22 NOTE — Telephone Encounter (Signed)
 Completed LTD paperwork from [employer] ....   Called the patient to advise same - however, Ms. Bobst is currently out of work due to her right side which is being treated (has separate claim for that w/[employer]) -- out of work since 05/12/2022 and came in for her left foot while out of work.  She does not plan on returning to work -- will be filing for disability.  Faxed the paperwork to [phone] -- advised patient same ....     J. Abbott -- 02/22/2023

## 2023-03-11 ENCOUNTER — Other Ambulatory Visit: Payer: Self-pay | Admitting: Nurse Practitioner

## 2023-03-11 DIAGNOSIS — Z1231 Encounter for screening mammogram for malignant neoplasm of breast: Secondary | ICD-10-CM

## 2023-03-23 ENCOUNTER — Ambulatory Visit
Admission: RE | Admit: 2023-03-23 | Discharge: 2023-03-23 | Disposition: A | Discharge status: Home / Self Care | Payer: Medicaid Other | Source: Ambulatory Visit | Attending: Nurse Practitioner | Admitting: Nurse Practitioner

## 2023-03-23 DIAGNOSIS — Z1231 Encounter for screening mammogram for malignant neoplasm of breast: Secondary | ICD-10-CM

## 2023-04-13 ENCOUNTER — Encounter: Payer: Medicaid Other | Admitting: Internal Medicine

## 2023-04-13 NOTE — Progress Notes (Deleted)
 Office Visit Note  Patient: Angelica Ponce             Date of Birth: 1960/06/16           MRN: [id number]             PCP: Patient, No Pcp Per Referring: Milus Height, PA Visit Date: 04/13/2023 Occupation: @GUAROCC @  Subjective:  No chief complaint on file.   History of Present Illness: Angelica Ponce is a 63 y.o. female ***     Activities of Daily Living:  Patient reports morning stiffness for *** {minute/hour:19697}.   Patient {ACTIONS;DENIES/REPORTS:21021675::"Denies"} nocturnal pain.  Difficulty dressing/grooming: {ACTIONS;DENIES/REPORTS:21021675::"Denies"} Difficulty climbing stairs: {ACTIONS;DENIES/REPORTS:21021675::"Denies"} Difficulty getting out of chair: {ACTIONS;DENIES/REPORTS:21021675::"Denies"} Difficulty using hands for taps, buttons, cutlery, and/or writing: {ACTIONS;DENIES/REPORTS:21021675::"Denies"}  No Rheumatology ROS completed.   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 12/30/2022   Seronegative rheumatoid arthritis (HCC) 12/30/2022   Low back pain 05/14/2022   Pain in right leg 02/10/2021    Past Medical History:  Diagnosis Date   Thyroid nodule     Family History  Problem Relation Age of Onset   Cancer Mother        breast cancer   Migraines Mother    Breast cancer Mother 67   Heart attack Maternal Grandmother    Migraines Maternal Grandmother    Heart attack Paternal Grandmother    Migraines Paternal Grandmother    Past Surgical History:  Procedure Laterality Date   TUBAL LIGATION     Social History   Social History Narrative   Not on file    There is no immunization history on file for this patient.   Objective: Vital Signs: LMP 03/24/2014    Physical Exam    Musculoskeletal Exam: ***  CDAI Exam: CDAI Score: -- Patient Global: --; Provider Global: -- Swollen: --; Tender: -- Joint Exam 04/13/2023   No joint exam has been documented for this visit   There is currently no information documented on the homunculus. Go to the Rheumatology activity and complete the homunculus joint exam.  Investigation: No additional findings.  Imaging: MM 3D SCREENING MAMMOGRAM BILATERAL BREAST Result Date: 03/25/2023 CLINICAL DATA:  Screening. EXAM: DIGITAL SCREENING BILATERAL MAMMOGRAM WITH TOMOSYNTHESIS AND CAD TECHNIQUE: Bilateral screening digital craniocaudal and mediolateral oblique mammograms were obtained. Bilateral screening digital breast tomosynthesis was performed. The images were evaluated with computer-aided detection. COMPARISON:  Previous exam(s). ACR Breast Density Category b: There are scattered areas of fibroglandular density. FINDINGS: There are no findings suspicious for malignancy. IMPRESSION: No mammographic evidence of malignancy. A result letter of this screening mammogram will be mailed directly to the patient. RECOMMENDATION: Screening mammogram in one year. (Code:SM-B-01Y) BI-RADS CATEGORY  1: Negative. Electronically Signed   By: Frederico Hamman M.D.   On: 03/25/2023 13:53    Recent Labs: No results found for: "WBC", "HGB", "PLT", "NA", "K", "CL", "CO2", "GLUCOSE", "BUN", "CREATININE", "BILITOT", "ALKPHOS", "AST", "ALT", "PROT", "ALBUMIN", "CALCIUM", "GFRAA", "QFTBGOLD", "QFTBGOLDPLUS"  Speciality Comments: No specialty comments available.  Procedures:  No procedures performed Allergies: Latex and Procaine   Assessment / Plan:     Visit Diagnoses: No diagnosis found.  Orders: No orders of the defined types were placed in this encounter.  No orders of the defined types were  placed in this encounter.   Face-to-face time spent with patient was *** minutes. Greater than 50% of time was spent in counseling and coordination of  care.  Follow-Up Instructions: No follow-ups on file.   Fuller Plan, MD  Note - This record has been created using [employer].  Chart creation errors have been sought, but may not always  have been located. Such creation errors do not reflect on  the standard of medical care.

## 2023-05-03 ENCOUNTER — Other Ambulatory Visit: Payer: Self-pay | Admitting: Podiatry

## 2023-09-04 ENCOUNTER — Other Ambulatory Visit: Payer: Self-pay | Admitting: Podiatry
